# Patient Record
Sex: Male | Born: 1965 | Race: White | Hispanic: No | Marital: Single | Smoking: Current every day smoker
Health system: Southern US, Community
[De-identification: ages and names within clinical notes are randomized; demographics above are authoritative.]

## PROBLEM LIST (undated history)

## (undated) DIAGNOSIS — E785 Hyperlipidemia, unspecified: Secondary | ICD-10-CM

## (undated) DIAGNOSIS — M109 Gout, unspecified: Secondary | ICD-10-CM

## (undated) DIAGNOSIS — I10 Essential (primary) hypertension: Secondary | ICD-10-CM

## (undated) DIAGNOSIS — Z800 Family history of malignant neoplasm of digestive organs: Secondary | ICD-10-CM

## (undated) DIAGNOSIS — R7303 Prediabetes: Secondary | ICD-10-CM

## (undated) HISTORY — DX: Gout, unspecified: M10.9

## (undated) HISTORY — DX: Family history of malignant neoplasm of digestive organs: Z80.0

## (undated) HISTORY — PX: FOOT SURGERY: SHX648

## (undated) HISTORY — DX: Prediabetes: R73.03

## (undated) HISTORY — DX: Hyperlipidemia, unspecified: E78.5

## (undated) HISTORY — DX: Essential (primary) hypertension: I10

---

## 2001-05-25 ENCOUNTER — Emergency Department (HOSPITAL_COMMUNITY): Admission: EM | Admit: 2001-05-25 | Discharge: 2001-05-25 | Payer: Self-pay | Admitting: Emergency Medicine

## 2006-12-23 ENCOUNTER — Ambulatory Visit: Payer: Self-pay | Admitting: Family Medicine

## 2009-10-17 ENCOUNTER — Emergency Department (HOSPITAL_BASED_OUTPATIENT_CLINIC_OR_DEPARTMENT_OTHER): Admission: EM | Admit: 2009-10-17 | Discharge: 2009-10-17 | Payer: Self-pay | Admitting: Emergency Medicine

## 2010-08-20 ENCOUNTER — Emergency Department (HOSPITAL_COMMUNITY)
Admission: EM | Admit: 2010-08-20 | Discharge: 2010-08-20 | Payer: Self-pay | Source: Home / Self Care | Admitting: Emergency Medicine

## 2010-11-27 LAB — DIFFERENTIAL
Basophils Absolute: 0.1 10*3/uL (ref 0.0–0.1)
Eosinophils Absolute: 0.3 10*3/uL (ref 0.0–0.7)
Eosinophils Relative: 3 % (ref 0–5)
Lymphocytes Relative: 45 % (ref 12–46)
Lymphs Abs: 4.9 10*3/uL — ABNORMAL HIGH (ref 0.7–4.0)
Monocytes Absolute: 0.6 10*3/uL (ref 0.1–1.0)

## 2010-11-27 LAB — COMPREHENSIVE METABOLIC PANEL
ALT: 26 U/L (ref 0–53)
AST: 21 U/L (ref 0–37)
Chloride: 101 mEq/L (ref 96–112)
GFR calc Af Amer: >60 mL/min (ref >60)
GFR calc non Af Amer: >60 mL/min (ref >60)
Sodium: 136 mEq/L (ref 135–145)

## 2010-11-27 LAB — CBC
HCT: 41.8 % (ref 39.0–52.0)
Hemoglobin: 14.8 g/dL (ref 13.0–17.0)
MCH: 32.3 pg (ref 26.0–34.0)
MCHC: 35.4 g/dL (ref 30.0–36.0)
MCV: 91.3 fL (ref 78.0–100.0)
Platelets: 229 10*3/uL (ref 150–400)
RDW: 12.7 % (ref 11.5–15.5)

## 2012-03-25 IMAGING — CR DG CHEST 1V PORT
1 series · 1 of 1 positions shown · non-contrast
Comparison: None.

CLINICAL DATA: Chest pain

PORTABLE CHEST - 1 VIEW

[view not recorded]
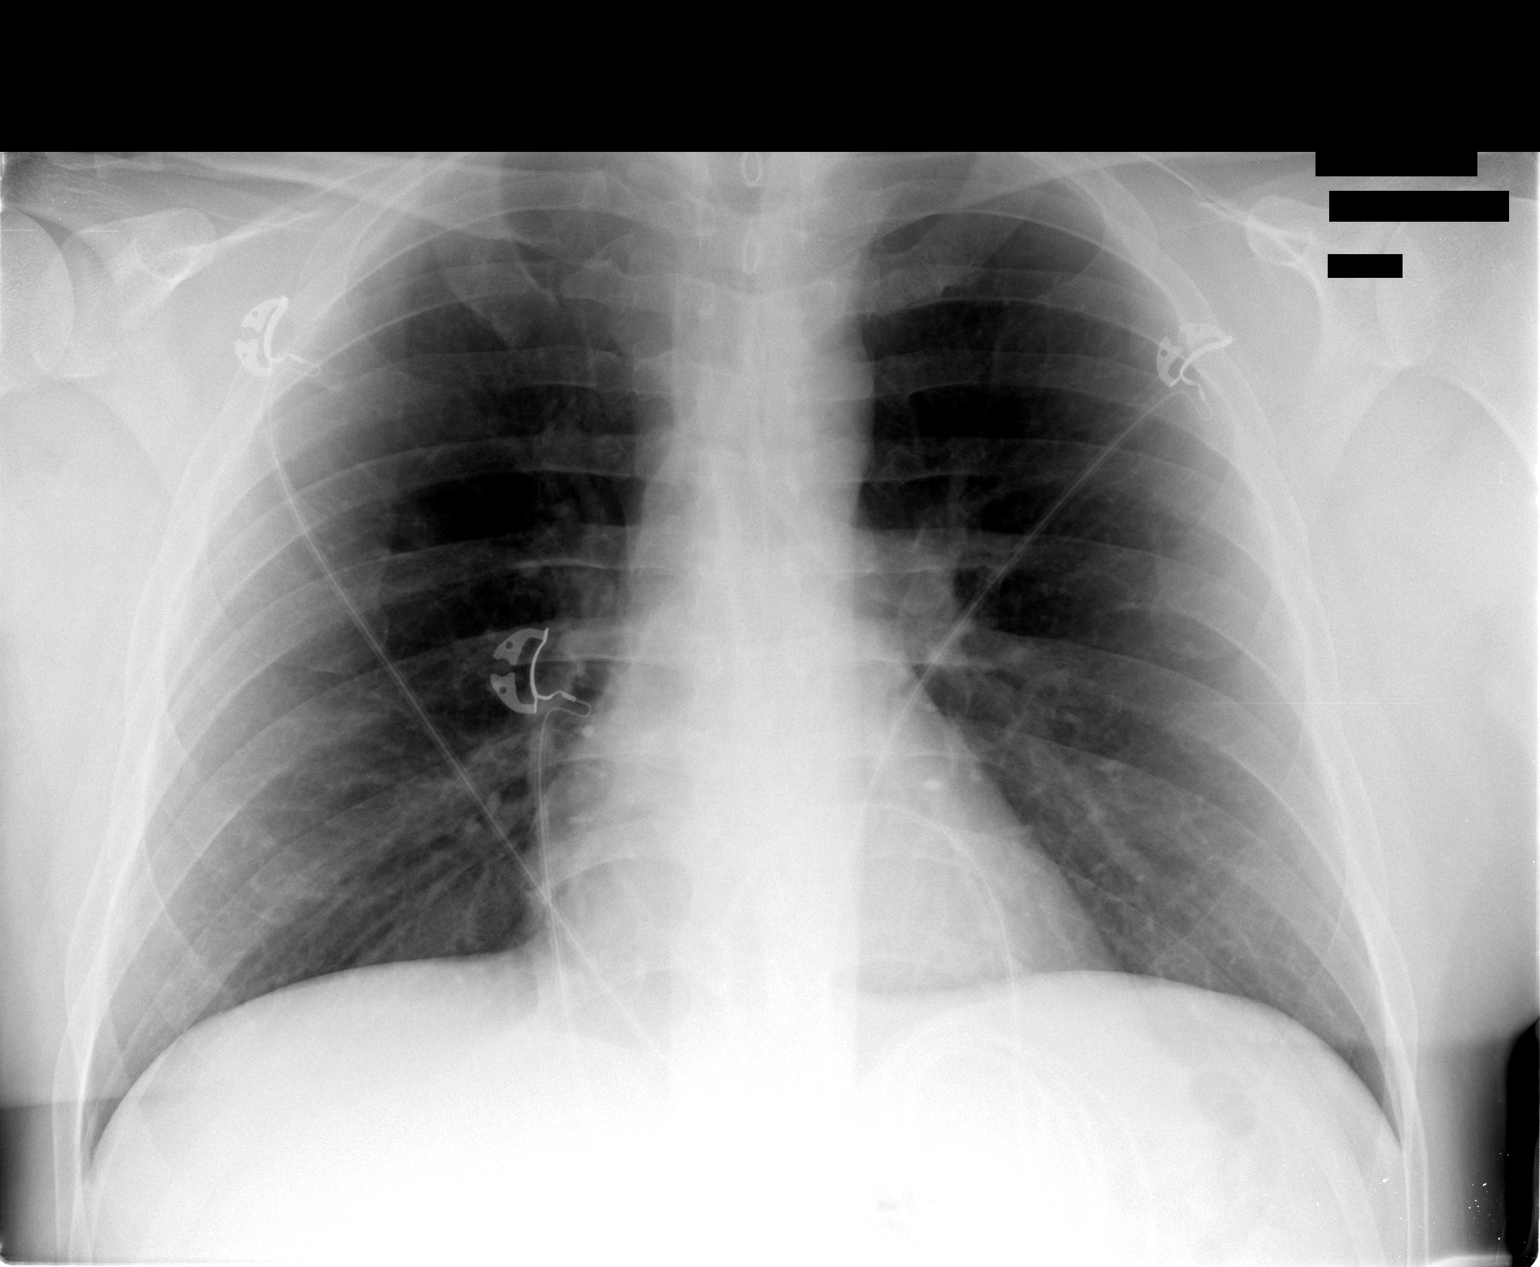

[1 of 1 positions shown; findings below may reference images not displayed]

FINDINGS: Heart and mediastinal contours normal.  Lungs clear.
Osseous structures intact in one-view.
IMPRESSION: No active disease in one-view.

## 2014-04-25 DIAGNOSIS — I10 Essential (primary) hypertension: Secondary | ICD-10-CM | POA: Insufficient documentation

## 2015-05-23 DIAGNOSIS — E785 Hyperlipidemia, unspecified: Secondary | ICD-10-CM | POA: Insufficient documentation

## 2021-09-21 ENCOUNTER — Encounter: Payer: Self-pay | Admitting: Family Medicine

## 2021-09-21 ENCOUNTER — Ambulatory Visit (INDEPENDENT_AMBULATORY_CARE_PROVIDER_SITE_OTHER): Payer: 59 | Admitting: Family Medicine

## 2021-09-21 VITALS — BP 135/85 | HR 68 | Temp 97.9°F | Ht 67.0 in | Wt 208.2 lb

## 2021-09-21 DIAGNOSIS — Z720 Tobacco use: Secondary | ICD-10-CM

## 2021-09-21 DIAGNOSIS — I10 Essential (primary) hypertension: Secondary | ICD-10-CM

## 2021-09-21 DIAGNOSIS — M109 Gout, unspecified: Secondary | ICD-10-CM

## 2021-09-21 DIAGNOSIS — Z800 Family history of malignant neoplasm of digestive organs: Secondary | ICD-10-CM

## 2021-09-21 DIAGNOSIS — R7303 Prediabetes: Secondary | ICD-10-CM | POA: Insufficient documentation

## 2021-09-21 DIAGNOSIS — E785 Hyperlipidemia, unspecified: Secondary | ICD-10-CM

## 2021-09-21 DIAGNOSIS — R0602 Shortness of breath: Secondary | ICD-10-CM

## 2021-09-21 DIAGNOSIS — R051 Acute cough: Secondary | ICD-10-CM

## 2021-09-21 MED ORDER — PREDNISONE 10 MG (21) PO TBPK: ORAL_TABLET | ORAL | 0 refills | Status: DC

## 2021-09-21 MED ORDER — ALBUTEROL SULFATE HFA 108 (90 BASE) MCG/ACT IN AERS: 2.0000 | INHALATION_SPRAY | Freq: Four times a day (QID) | RESPIRATORY_TRACT | 1 refills | Status: DC | PRN

## 2021-09-21 MED ORDER — HYDROCODONE BIT-HOMATROP MBR 5-1.5 MG/5ML PO SOLN: 5.0000 mL | Freq: Three times a day (TID) | ORAL | 0 refills | Status: DC | PRN

## 2021-09-21 NOTE — Progress Notes (Signed)
Assessment & Plan:  1. Essential hypertension Well controlled on current regimen.  - CBC with Differential/Platelet; Future - CMP14+EGFR; Future - Lipid panel; Future  2. Dyslipidemia Labs to assess. - CMP14+EGFR; Future - Lipid panel; Future  3. Prediabetes Labs to assess. - Bayer DCA Hb A1c Waived; Future  4. Controlled gout Well controlled on current regimen. Discussed with patient that if he wants to try to stop Allopurinol that would be okay. If he starts having flares again, he will need to resume. - Uric acid; Future  5. Family history of colon cancer in father Will look for most recent Cologuard.  6. Tobacco use Encouraged smoking cessation.  7. Acute cough - HYDROcodone bit-homatropine (HYCODAN) 5-1.5 MG/5ML syrup; Take 5 mLs by mouth every 8 (eight) hours as needed for cough.  Dispense: 120 mL; Refill: 0 - predniSONE (STERAPRED UNI-PAK 21 TAB) 10 MG (21) TBPK tablet; As directed x 6 days.  Dispense: 21 tablet; Refill: 0  8. Shortness of breath - albuterol (VENTOLIN HFA) 108 (90 Base) MCG/ACT inhaler; Inhale 2 puffs into the lungs every 6 (six) hours as needed for wheezing or shortness of breath.  Dispense: 18 g; Refill: 1   Patient will return for fasting labs next week.  Return in about 3 months (around 12/20/2021) for annual physical.  Hendricks Limes, MSN, APRN, FNP-C [facility]  Subjective:    Patient ID: Kirk Harmon, male    DOB: Feb 07, 1966, 56 y.o.   MRN: [id number]  Patient Care Team: Loman Brooklyn, FNP as PCP - General (Family Medicine)   Chief Complaint:  Chief Complaint  Patient presents with   New Patient (Initial Visit)    [facility]    Establish Care   Cough   Wheezing    X 2 weeks     HPI: Kirk Harmon is a 56 y.o. male presenting on 09/21/2021 for New Patient (Initial Visit) ([facility] ), Establish Care, Cough, and Wheezing (X 2 weeks )  Gout: taking Allopurinol. Last gout flare 15 years ago. 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  GERD: taking Nexium daily.  Hypertension: taking lisinopril daily.  Hyperlipidemia: taking fenofibrate daily and simvastatin with CoQ10 every once in a while; he reports it makes him feel bad and drags him down. He was prescribed this many years ago. It is the only statin he has ever tried.   Family history of colon cancer in father: patient reports he has completed Cologuard and FOBTs in the past. Unsure of last date of completion.   Patient complains of cough, runny nose, postnasal drainage, and wheezing. Onset of symptoms was 2 weeks ago, gradually worsening since that time, but he cannot shake the these last symptoms. He is drinking plenty of fluids. Evaluation to date: seen previously and thought to have sinusitis. Treatment to date: antibiotics and cough suppressants.  He does smoke.    Social history:  Relevant past medical, surgical, family and social history reviewed and updated as indicated. Interim medical history since our last visit reviewed.  Allergies and medications reviewed and updated.  DATA REVIEWED: CHART IN EPIC  ROS: Negative unless specifically indicated above in HPI.    Current Outpatient Medications:    allopurinol (ZYLOPRIM) 100 MG tablet, Take 1 tablet by mouth daily., Disp: , Rfl:    aspirin EC 81 MG tablet, Take 81 mg by mouth daily. Swallow whole., Disp: , Rfl:  Coenzyme Q10 50 MG CAPS, Take 1 tablet by mouth daily., Disp: , Rfl:    Esomeprazole Magnesium (NEXIUM 24HR PO), Take by mouth., Disp: , Rfl:    fenofibrate 160 MG tablet, Take 1 tablet by mouth daily., Disp: , Rfl:    lisinopril (ZESTRIL) 10 MG tablet, Take 1 tablet by mouth daily., Disp: , Rfl:    Multiple Vitamins-Minerals (ONE-A-DAY MENS 50+ PO), Take by mouth., Disp: , Rfl:    simvastatin (ZOCOR) 20 MG tablet, Take 20 mg by mouth daily., Disp: , Rfl:    Allergies  Allergen Reactions   Paroxetine Hcl Other (See  Comments)    insominia   Past Medical History:  Diagnosis Date   Family history of colon cancer in father    Gout    Hyperlipidemia    Hypertension     Past Surgical History:  Procedure Laterality Date   FOOT SURGERY Right     Social History   Socioeconomic History   Marital status: Single    Spouse name: Not on file   Number of children: Not on file   Years of education: Not on file   Highest education level: Not on file  Occupational History   Not on file  Tobacco Use   Smoking status: Every Day    Packs/day: 1.00    Types: Cigarettes   Smokeless tobacco: Never  Vaping Use   Vaping Use: Never used  Substance and Sexual Activity   Alcohol use: Not Currently   Drug use: Never   Sexual activity: Not Currently  Other Topics Concern   Not on file  Social History Narrative   Not on file   Social Determinants of Health   Financial Resource Strain: Not on file  Food Insecurity: Not on file  Transportation Needs: Not on file  Physical Activity: Not on file  Stress: Not on file  Social Connections: Not on file  Intimate Partner Violence: Not on file        Objective:    BP 135/85    Pulse 68    Temp 97.9 F (36.6 C) (Temporal)    Ht 5' 7"  (1.702 m)    Wt 208 lb 3.2 oz (94.4 kg)    SpO2 98%    BMI 32.61 kg/m   Wt Readings from Last 3 Encounters:  09/21/21 208 lb 3.2 oz (94.4 kg)    Physical Exam Vitals reviewed.  Constitutional:      General: He is not in acute distress.    Appearance: Normal appearance. He is obese. He is not ill-appearing, toxic-appearing or diaphoretic.  HENT:     Head: Normocephalic and atraumatic.  Eyes:     General: No scleral icterus.       Right eye: No discharge.        Left eye: No discharge.     Conjunctiva/sclera: Conjunctivae normal.  Cardiovascular:     Rate and Rhythm: Normal rate and regular rhythm.     Heart sounds: Normal heart sounds. No murmur heard.   No friction rub. No gallop.  Pulmonary:     Effort:  Pulmonary effort is normal. No respiratory distress.     Breath sounds: No stridor. Wheezing present. No rhonchi or rales.  Musculoskeletal:        General: Normal range of motion.     Cervical back: Normal range of motion.  Skin:    General: Skin is warm and dry.  Neurological:     Mental Status: He is alert  and oriented to person, place, and time. Mental status is at baseline.  Psychiatric:        Mood and Affect: Mood normal.        Behavior: Behavior normal.        Thought Content: Thought content normal.        Judgment: Judgment normal.    No results found for: TSH Lab Results  Component Value Date   WBC 11.0 (H) 08/20/2010   HGB 14.8 08/20/2010   HCT 41.8 08/20/2010   MCV 91.3 08/20/2010   PLT 229 08/20/2010   Lab Results  Component Value Date   NA 136 08/20/2010   K 3.7 08/20/2010   CO2 26 08/20/2010   GLUCOSE 103 (H) 08/20/2010   BUN 14 08/20/2010   CREATININE 0.97 08/20/2010   BILITOT 0.3 08/20/2010   ALKPHOS 87 08/20/2010   AST 21 08/20/2010   ALT 26 08/20/2010   PROT 6.9 08/20/2010   ALBUMIN 4.1 08/20/2010   CALCIUM 9.4 08/20/2010   No results found for: CHOL No results found for: HDL No results found for: LDLCALC No results found for: TRIG No results found for: CHOLHDL No results found for: HGBA1C

## 2021-09-28 ENCOUNTER — Other Ambulatory Visit: Payer: 59

## 2021-09-28 DIAGNOSIS — R7303 Prediabetes: Secondary | ICD-10-CM

## 2021-09-28 DIAGNOSIS — E785 Hyperlipidemia, unspecified: Secondary | ICD-10-CM

## 2021-09-28 DIAGNOSIS — M109 Gout, unspecified: Secondary | ICD-10-CM

## 2021-09-28 DIAGNOSIS — I10 Essential (primary) hypertension: Secondary | ICD-10-CM

## 2021-09-28 LAB — BAYER DCA HB A1C WAIVED: HB A1C (BAYER DCA - WAIVED): 5.8 % — ABNORMAL HIGH (ref 4.8–5.6)

## 2021-09-29 LAB — CBC WITH DIFFERENTIAL/PLATELET
Basophils Absolute: 0.1 10*3/uL (ref 0.0–0.2)
Basos: 1 %
EOS (ABSOLUTE): 0.2 10*3/uL (ref 0.0–0.4)
Eos: 2 %
Hematocrit: 43.3 % (ref 37.5–51.0)
Hemoglobin: 15.2 g/dL (ref 13.0–17.7)
Immature Grans (Abs): 0 10*3/uL (ref 0.0–0.1)
Immature Granulocytes: 0 %
Lymphocytes Absolute: 6.1 10*3/uL — ABNORMAL HIGH (ref 0.7–3.1)
Lymphs: 54 %
MCH: 32 pg (ref 26.6–33.0)
MCHC: 35.1 g/dL (ref 31.5–35.7)
MCV: 91 fL (ref 79–97)
Monocytes Absolute: 0.7 10*3/uL (ref 0.1–0.9)
Monocytes: 7 %
Neutrophils Absolute: 4 10*3/uL (ref 1.4–7.0)
Neutrophils: 36 %
Platelets: 303 10*3/uL (ref 150–450)
RBC: 4.75 x10E6/uL (ref 4.14–5.80)
RDW: 12.1 % (ref 11.6–15.4)
WBC: 11.2 10*3/uL — ABNORMAL HIGH (ref 3.4–10.8)

## 2021-09-29 LAB — CMP14+EGFR
ALT: 37 IU/L (ref 0–44)
AST: 17 IU/L (ref 0–40)
Albumin/Globulin Ratio: 2.5 — ABNORMAL HIGH (ref 1.2–2.2)
Albumin: 4.7 g/dL (ref 3.8–4.9)
Alkaline Phosphatase: 88 IU/L (ref 44–121)
BUN/Creatinine Ratio: 15 (ref 9–20)
BUN: 21 mg/dL (ref 6–24)
Bilirubin Total: 0.3 mg/dL (ref 0.0–1.2)
CO2: 24 mmol/L (ref 20–29)
Calcium: 9.4 mg/dL (ref 8.7–10.2)
Chloride: 102 mmol/L (ref 96–106)
Creatinine, Ser: 1.41 mg/dL — ABNORMAL HIGH (ref 0.76–1.27)
Globulin, Total: 1.9 g/dL (ref 1.5–4.5)
Glucose: 85 mg/dL (ref 70–99)
Potassium: 4 mmol/L (ref 3.5–5.2)
Sodium: 139 mmol/L (ref 134–144)
Total Protein: 6.6 g/dL (ref 6.0–8.5)
eGFR: 59 mL/min/{1.73_m2} — ABNORMAL LOW (ref >59)

## 2021-09-29 LAB — URIC ACID: Uric Acid: 5.5 mg/dL (ref 3.8–8.4)

## 2021-09-29 LAB — LIPID PANEL
Chol/HDL Ratio: 4.4 ratio (ref 0.0–5.0)
Cholesterol, Total: 180 mg/dL (ref 100–199)
HDL: 41 mg/dL (ref >39)
LDL Chol Calc (NIH): 110 mg/dL — ABNORMAL HIGH (ref 0–99)
Triglycerides: 164 mg/dL — ABNORMAL HIGH (ref 0–149)
VLDL Cholesterol Cal: 29 mg/dL (ref 5–40)

## 2021-10-02 ENCOUNTER — Telehealth: Payer: Self-pay | Admitting: Family Medicine

## 2021-10-02 NOTE — Telephone Encounter (Signed)
Please let patient know I looked on [employer]'s website and I see where the test was ordered, but not completed. Would he like me to reorder or does he still have the kit at home?

## 2021-10-02 NOTE — Telephone Encounter (Signed)
LMTCB

## 2021-10-08 NOTE — Telephone Encounter (Signed)
Wife aware per dpr and states he still has kit at home and she will get him to complete it.

## 2022-02-08 ENCOUNTER — Encounter: Payer: 59 | Admitting: Nurse Practitioner

## 2022-04-22 ENCOUNTER — Encounter: Payer: Self-pay | Admitting: *Deleted

## 2022-06-21 ENCOUNTER — Encounter: Payer: 59 | Admitting: Family Medicine

## 2022-08-23 ENCOUNTER — Ambulatory Visit (INDEPENDENT_AMBULATORY_CARE_PROVIDER_SITE_OTHER): Payer: Self-pay | Admitting: Family Medicine

## 2022-08-23 ENCOUNTER — Encounter: Payer: Self-pay | Admitting: Family Medicine

## 2022-08-23 VITALS — BP 137/80 | HR 76 | Temp 98.2°F | Ht 67.0 in | Wt 200.2 lb

## 2022-08-23 DIAGNOSIS — R7303 Prediabetes: Secondary | ICD-10-CM

## 2022-08-23 DIAGNOSIS — I10 Essential (primary) hypertension: Secondary | ICD-10-CM

## 2022-08-23 DIAGNOSIS — M109 Gout, unspecified: Secondary | ICD-10-CM

## 2022-08-23 DIAGNOSIS — E785 Hyperlipidemia, unspecified: Secondary | ICD-10-CM

## 2022-08-23 LAB — BMP8+EGFR
Calcium: 9.9 mg/dL (ref 8.7–10.2)
Chloride: 105 mmol/L (ref 96–106)

## 2022-08-23 MED ORDER — LISINOPRIL 10 MG PO TABS: 10.0000 mg | ORAL_TABLET | Freq: Every day | ORAL | 3 refills | Status: AC

## 2022-08-23 MED ORDER — ALLOPURINOL 100 MG PO TABS: 100.0000 mg | ORAL_TABLET | Freq: Every day | ORAL | 3 refills | Status: AC

## 2022-08-23 MED ORDER — FENOFIBRATE 160 MG PO TABS: 160.0000 mg | ORAL_TABLET | Freq: Every day | ORAL | 3 refills | Status: AC

## 2022-08-23 MED ORDER — PRAVASTATIN SODIUM 10 MG PO TABS: 10.0000 mg | ORAL_TABLET | Freq: Every day | ORAL | 0 refills | Status: DC

## 2022-08-23 NOTE — Progress Notes (Signed)
Subjective:  Patient ID: Kirk Harmon, male    DOB: 10-Jul-1966, 56 y.o.   MRN: [id number]  Patient Care Team: Baruch Gouty, FNP as PCP - General (Family Medicine)   Chief Complaint:  Annual Exam   HPI: Kirk Harmon is a 56 y.o. male presenting on 08/23/2022 for Annual Exam   Pt presents today to establish care with new PCP and for management of chronic medical problems. He does not have insurance at this time and does not wish to do a CPE today. Agrees to [facility] for labs. Will return in January for a CPE as he will have insurance then.    1. Essential hypertension Well controlled on current regimen. Denies chest pain, shortness of breath, leg swelling, headaches, visual changes, weakness, or confusion.   2. Dyslipidemia Unable to take simvastatin, states he was supposed to be switched to another medication but never was. Has not been taking statin in over 11 months. Does take fenofibrate as prescribed. He does not follow a diet or exercise routine.   3. Controlled gout On Allopurinol. Denies recent flares of gout. Tolerating medications well.   4. Prediabetes Does not follow a diet or exercise routine. Denies polyuria, polyphagia, or polydipsia.      Relevant past medical, surgical, family, and social history reviewed and updated as indicated.  Allergies and medications reviewed and updated. Data reviewed: Chart in Epic.   Past Medical History:  Diagnosis Date   Family history of colon cancer in father    Gout    Hyperlipidemia    Hypertension    Prediabetes     Past Surgical History:  Procedure Laterality Date   FOOT SURGERY Right     Social History   Socioeconomic History   Marital status: Single    Spouse name: Not on file   Number of children: Not on file   Years of education: Not on file   Highest education level: Not on file  Occupational History   Not on file  Tobacco Use   Smoking status: Every Day    Packs/day: 1.00    Types: Cigarettes    Smokeless tobacco: Never  Vaping Use   Vaping Use: Never used  Substance and Sexual Activity   Alcohol use: Not Currently   Drug use: Never   Sexual activity: Not Currently  Other Topics Concern   Not on file  Social History Narrative   Not on file   Social Determinants of Health   Financial Resource Strain: Not on file  Food Insecurity: Not on file  Transportation Needs: Not on file  Physical Activity: Not on file  Stress: Not on file  Social Connections: Not on file  Intimate Partner Violence: Not on file    Outpatient Encounter Medications as of 08/23/2022  Medication Sig   Multiple Vitamins-Minerals (ONE-A-DAY MENS 50+ PO) Take by mouth.   omeprazole (PRILOSEC OTC) 20 MG tablet Take 20 mg by mouth daily.   pravastatin (PRAVACHOL) 10 MG tablet Take 1 tablet (10 mg total) by mouth daily.   [DISCONTINUED] allopurinol (ZYLOPRIM) 100 MG tablet Take 1 tablet by mouth daily.   [DISCONTINUED] fenofibrate 160 MG tablet Take 1 tablet by mouth daily.   [DISCONTINUED] lisinopril (ZESTRIL) 10 MG tablet Take 1 tablet by mouth daily.   allopurinol (ZYLOPRIM) 100 MG tablet Take 1 tablet (100 mg total) by mouth daily.   fenofibrate 160 MG tablet Take 1 tablet (160 mg total) by mouth daily.   lisinopril (  ZESTRIL) 10 MG tablet Take 1 tablet (10 mg total) by mouth daily.   [DISCONTINUED] albuterol (VENTOLIN HFA) 108 (90 Base) MCG/ACT inhaler Inhale 2 puffs into the lungs every 6 (six) hours as needed for wheezing or shortness of breath. (Patient not taking: Reported on 08/23/2022)   [DISCONTINUED] aspirin EC 81 MG tablet Take 81 mg by mouth daily. Swallow whole.   [DISCONTINUED] Coenzyme Q10 50 MG CAPS Take 1 tablet by mouth daily.   [DISCONTINUED] Esomeprazole Magnesium (NEXIUM 24HR PO) Take by mouth.   [DISCONTINUED] HYDROcodone bit-homatropine (HYCODAN) 5-1.5 MG/5ML syrup Take 5 mLs by mouth every 8 (eight) hours as needed for cough.   [DISCONTINUED] predniSONE (STERAPRED UNI-PAK 21 TAB) 10  MG (21) TBPK tablet As directed x 6 days.   [DISCONTINUED] simvastatin (ZOCOR) 20 MG tablet Take 20 mg by mouth daily.   No facility-administered encounter medications on file as of 08/23/2022.    Allergies  Allergen Reactions   Paroxetine Hcl Other (See Comments)    insominia    Review of Systems  Constitutional:  Positive for fatigue. Negative for activity change, appetite change, chills, diaphoresis, fever and unexpected weight change.  HENT: Negative.    Eyes: Negative.  Negative for photophobia and visual disturbance.  Respiratory:  Negative for cough, chest tightness and shortness of breath.   Cardiovascular:  Negative for chest pain, palpitations and leg swelling.  Gastrointestinal:  Negative for abdominal pain, blood in stool, constipation, diarrhea, nausea and vomiting.  Endocrine: Negative.   Genitourinary:  Negative for decreased urine volume, difficulty urinating, dysuria, frequency and urgency.  Musculoskeletal:  Negative for arthralgias and myalgias.  Skin: Negative.   Allergic/Immunologic: Negative.   Neurological:  Negative for dizziness, tremors, seizures, syncope, facial asymmetry, speech difficulty, weakness, light-headedness, numbness and headaches.  Hematological: Negative.   Psychiatric/Behavioral:  Negative for confusion, hallucinations, sleep disturbance and suicidal ideas.   All other systems reviewed and are negative.       Objective:  BP 137/80   Pulse 76   Temp 98.2 F (36.8 C)   Ht _0  (1.702 m)   Wt 200 lb 3.2 oz (90.8 kg)   SpO2 98%   BMI 31.36 kg/m    Wt Readings from Last 3 Encounters:  08/23/22 200 lb 3.2 oz (90.8 kg)  09/21/21 208 lb 3.2 oz (94.4 kg)    Physical Exam Vitals and nursing note reviewed.  Constitutional:      General: He is not in acute distress.    Appearance: Normal appearance. He is well-developed and well-groomed. He is obese. He is not ill-appearing, toxic-appearing or diaphoretic.  HENT:     Head:  Normocephalic and atraumatic.     Jaw: There is normal jaw occlusion.     Right Ear: Hearing normal.     Left Ear: Hearing normal.     Nose: Nose normal.     Mouth/Throat:     Lips: Pink.     Mouth: Mucous membranes are moist.     Pharynx: Uvula midline.  Eyes:     General: Lids are normal.     Pupils: Pupils are equal, round, and reactive to light.  Neck:     Thyroid: No thyroid mass, thyromegaly or thyroid tenderness.     Vascular: No JVD.     Trachea: Trachea and phonation normal.  Cardiovascular:     Rate and Rhythm: Normal rate and regular rhythm.     Chest Wall: PMI is not displaced.     Heart sounds: Normal  heart sounds. No murmur heard.    No friction rub. No gallop.  Pulmonary:     Effort: Pulmonary effort is normal. No respiratory distress.     Breath sounds: Normal breath sounds. No wheezing.  Abdominal:     General: There is no abdominal bruit.     Palpations: Abdomen is soft. There is no hepatomegaly or splenomegaly.  Musculoskeletal:     Cervical back: Normal range of motion and neck supple.     Right lower leg: No edema.     Left lower leg: No edema.  Skin:    General: Skin is warm and dry.     Capillary Refill: Capillary refill takes less than 2 seconds.     Coloration: Skin is not cyanotic, jaundiced or pale.     Findings: No rash.  Neurological:     General: No focal deficit present.     Mental Status: He is alert and oriented to person, place, and time.     Sensory: Sensation is intact.     Motor: Motor function is intact.     Coordination: Coordination is intact.     Gait: Gait is intact.     Deep Tendon Reflexes: Reflexes are normal and symmetric.  Psychiatric:        Attention and Perception: Attention and perception normal.        Mood and Affect: Mood and affect normal.        Speech: Speech normal.        Behavior: Behavior normal. Behavior is cooperative.        Thought Content: Thought content normal.        Cognition and Memory: Cognition  and memory normal.        Judgment: Judgment normal.     Results for orders placed or performed in visit on 09/28/21  Uric acid  Result Value Ref Range   Uric Acid 5.5 3.8 - 8.4 mg/dL  Bayer DCA Hb A1c Waived  Result Value Ref Range   HB A1C (BAYER DCA - WAIVED) 5.8 (H) 4.8 - 5.6 %  Lipid panel  Result Value Ref Range   Cholesterol, Total 180 100 - 199 mg/dL   Triglycerides 164 (H) 0 - 149 mg/dL   HDL 41 >39 mg/dL   VLDL Cholesterol Cal 29 5 - 40 mg/dL   LDL Chol Calc (NIH) 110 (H) 0 - 99 mg/dL   Chol/HDL Ratio 4.4 0.0 - 5.0 ratio  CMP14+EGFR  Result Value Ref Range   Glucose 85 70 - 99 mg/dL   BUN 21 6 - 24 mg/dL   Creatinine, Ser 1.41 (H) 0.76 - 1.27 mg/dL   eGFR 59 (L) >59 mL/min/1.73   BUN/Creatinine Ratio 15 9 - 20   Sodium 139 134 - 144 mmol/L   Potassium 4.0 3.5 - 5.2 mmol/L   Chloride 102 96 - 106 mmol/L   CO2 24 20 - 29 mmol/L   Calcium 9.4 8.7 - 10.2 mg/dL   Total Protein 6.6 6.0 - 8.5 g/dL   Albumin 4.7 3.8 - 4.9 g/dL   Globulin, Total 1.9 1.5 - 4.5 g/dL   Albumin/Globulin Ratio 2.5 (H) 1.2 - 2.2   Bilirubin Total 0.3 0.0 - 1.2 mg/dL   Alkaline Phosphatase 88 44 - 121 IU/L   AST 17 0 - 40 IU/L   ALT 37 0 - 44 IU/L  CBC with Differential/Platelet  Result Value Ref Range   WBC 11.2 (H) 3.4 - 10.8 x10E3/uL   RBC 4.75  4.14 - 5.80 x10E6/uL   Hemoglobin 15.2 13.0 - 17.7 g/dL   Hematocrit 43.3 37.5 - 51.0 %   MCV 91 79 - 97 fL   MCH 32.0 26.6 - 33.0 pg   MCHC 35.1 31.5 - 35.7 g/dL   RDW 12.1 11.6 - 15.4 %   Platelets 303 150 - 450 x10E3/uL   Neutrophils 36 Not Estab. %   Lymphs 54 Not Estab. %   Monocytes 7 Not Estab. %   Eos 2 Not Estab. %   Basos 1 Not Estab. %   Neutrophils Absolute 4.0 1.4 - 7.0 x10E3/uL   Lymphocytes Absolute 6.1 (H) 0.7 - 3.1 x10E3/uL   Monocytes Absolute 0.7 0.1 - 0.9 x10E3/uL   EOS (ABSOLUTE) 0.2 0.0 - 0.4 x10E3/uL   Basophils Absolute 0.1 0.0 - 0.2 x10E3/uL   Immature Granulocytes 0 Not Estab. %   Immature Grans (Abs) 0.0 0.0  - 0.1 x10E3/uL       Pertinent labs & imaging results that were available during my care of the patient were reviewed by me and considered in my medical decision making.  Assessment & Plan:  Kirk Harmon was seen today for annual exam.  Diagnoses and all orders for this visit:  Essential hypertension BP well controlled. Changes were not made in regimen today. Goal BP is 130/80. Pt aware to report any persistent high or low readings. DASH diet and exercise encouraged. Exercise at least 150 minutes per week and increase as tolerated. Goal BMI > 25. Stress management encouraged. Avoid nicotine and tobacco product use. Avoid excessive alcohol and NSAID's. Avoid more than 2000 mg of sodium daily. Medications as prescribed. Follow up as scheduled.  -     BMP8+EGFR -     lisinopril (ZESTRIL) 10 MG tablet; Take 1 tablet (10 mg total) by mouth daily.  Dyslipidemia Not able to tolerate simvastatin, will trial pravastatin as it has a lower side effect profile. Declined to have lipids checked today, will return in January when he has insurance. Diet and exercise encouraged.  -     BMP8+EGFR -     fenofibrate 160 MG tablet; Take 1 tablet (160 mg total) by mouth daily. -     pravastatin (PRAVACHOL) 10 MG tablet; Take 1 tablet (10 mg total) by mouth daily.  Controlled gout Well controlled on below, will continue. -     allopurinol (ZYLOPRIM) 100 MG tablet; Take 1 tablet (100 mg total) by mouth daily.  Prediabetes Diet and exercise encouraged. BMP pending, declines other labs today.  -     BMP8+EGFR     Continue all other maintenance medications.  Follow up plan: Return in about 1 month (around 09/23/2022), or if symptoms worsen or fail to improve, for all labs / CPE.   Continue healthy lifestyle choices, including diet (rich in fruits, vegetables, and lean proteins, and low in salt and simple carbohydrates) and exercise (at least 30 minutes of moderate physical activity daily).    The above  assessment and management plan was discussed with the patient. The patient verbalized understanding of and has agreed to the management plan. Patient is aware to call the clinic if they develop any new symptoms or if symptoms persist or worsen. Patient is aware when to return to the clinic for a follow-up visit. Patient educated on when it is appropriate to go to the emergency department.   Monia Pouch, FNP-C [facility] [phone]

## 2022-08-24 LAB — BMP8+EGFR
BUN/Creatinine Ratio: 12 (ref 9–20)
BUN: 15 mg/dL (ref 6–24)
CO2: 22 mmol/L (ref 20–29)
Creatinine, Ser: 1.22 mg/dL (ref 0.76–1.27)
Glucose: 118 mg/dL — ABNORMAL HIGH (ref 70–99)
Potassium: 5 mmol/L (ref 3.5–5.2)
Sodium: 142 mmol/L (ref 134–144)

## 2022-09-10 ENCOUNTER — Other Ambulatory Visit: Payer: Self-pay | Admitting: Family Medicine

## 2022-09-10 DIAGNOSIS — E785 Hyperlipidemia, unspecified: Secondary | ICD-10-CM

## 2022-11-01 ENCOUNTER — Encounter: Payer: Self-pay | Admitting: Family Medicine

## 2023-06-20 ENCOUNTER — Other Ambulatory Visit: Payer: Self-pay | Admitting: Family Medicine

## 2023-06-20 DIAGNOSIS — Z1212 Encounter for screening for malignant neoplasm of rectum: Secondary | ICD-10-CM

## 2023-06-20 DIAGNOSIS — Z1211 Encounter for screening for malignant neoplasm of colon: Secondary | ICD-10-CM
